# Patient Record
Sex: Female | Born: 1985 | Race: Black or African American | Hispanic: No | Marital: Single
Health system: Southern US, Community
[De-identification: ages and names within clinical notes are randomized; demographics above are authoritative.]

---

## 2006-09-13 ENCOUNTER — Emergency Department (HOSPITAL_COMMUNITY): Admission: EM | Admit: 2006-09-13 | Discharge: 2006-09-13 | Payer: Self-pay | Admitting: Emergency Medicine

## 2008-12-14 ENCOUNTER — Emergency Department (HOSPITAL_COMMUNITY): Admission: EM | Admit: 2008-12-14 | Discharge: 2008-12-14 | Payer: Self-pay | Admitting: Emergency Medicine

## 2009-02-01 ENCOUNTER — Ambulatory Visit (HOSPITAL_COMMUNITY): Admission: RE | Admit: 2009-02-01 | Discharge: 2009-02-01 | Payer: Self-pay | Admitting: Family Medicine

## 2009-06-22 ENCOUNTER — Emergency Department (HOSPITAL_COMMUNITY): Admission: EM | Admit: 2009-06-22 | Discharge: 2009-06-22 | Payer: Self-pay | Admitting: Emergency Medicine

## 2009-07-10 ENCOUNTER — Emergency Department (HOSPITAL_COMMUNITY): Admission: EM | Admit: 2009-07-10 | Discharge: 2009-07-10 | Payer: Self-pay | Admitting: Emergency Medicine

## 2009-11-26 ENCOUNTER — Emergency Department (HOSPITAL_COMMUNITY): Admission: EM | Admit: 2009-11-26 | Discharge: 2009-11-27 | Payer: Self-pay | Admitting: Emergency Medicine

## 2010-07-29 ENCOUNTER — Emergency Department (HOSPITAL_COMMUNITY)
Admission: EM | Admit: 2010-07-29 | Discharge: 2010-07-29 | Payer: Self-pay | Source: Home / Self Care | Admitting: Emergency Medicine

## 2010-08-07 ENCOUNTER — Emergency Department (HOSPITAL_COMMUNITY): Admission: EM | Admit: 2010-08-07 | Discharge: 2009-10-23 | Payer: Self-pay | Admitting: Emergency Medicine

## 2010-11-11 LAB — URINALYSIS, ROUTINE W REFLEX MICROSCOPIC
Hgb urine dipstick: NEGATIVE
Ketones, ur: NEGATIVE mg/dL
Protein, ur: NEGATIVE mg/dL
Urobilinogen, UA: 0.2 mg/dL (ref 0.0–1.0)

## 2010-11-11 LAB — POCT I-STAT, CHEM 8
HCT: 42 % (ref 36.0–46.0)
Hemoglobin: 14.3 g/dL (ref 12.0–15.0)
Potassium: 4 mEq/L (ref 3.5–5.1)
Sodium: 144 mEq/L (ref 135–145)

## 2010-11-19 LAB — URINALYSIS, ROUTINE W REFLEX MICROSCOPIC
Bilirubin Urine: NEGATIVE
Hgb urine dipstick: NEGATIVE
Nitrite: NEGATIVE
pH: 6.5 (ref 5.0–8.0)

## 2010-11-19 LAB — WET PREP, GENITAL
Clue Cells Wet Prep HPF POC: NONE SEEN
Trich, Wet Prep: NONE SEEN
Yeast Wet Prep HPF POC: NONE SEEN

## 2010-11-19 LAB — URINE MICROSCOPIC-ADD ON

## 2010-11-19 LAB — GC/CHLAMYDIA PROBE AMP, GENITAL: Chlamydia, DNA Probe: NEGATIVE

## 2010-11-19 LAB — PREGNANCY, URINE: Preg Test, Ur: NEGATIVE

## 2010-11-23 LAB — POCT I-STAT, CHEM 8
BUN: 11 mg/dL (ref 6–23)
Chloride: 103 mEq/L (ref 96–112)
Potassium: 3.4 mEq/L — ABNORMAL LOW (ref 3.5–5.1)
Sodium: 141 mEq/L (ref 135–145)
TCO2: 29 mmol/L (ref 0–100)

## 2010-11-23 LAB — URINALYSIS, ROUTINE W REFLEX MICROSCOPIC
Glucose, UA: NEGATIVE mg/dL
Hgb urine dipstick: NEGATIVE
Specific Gravity, Urine: 1.023 (ref 1.005–1.030)
pH: 8 (ref 5.0–8.0)

## 2010-11-23 LAB — URINE MICROSCOPIC-ADD ON

## 2010-12-03 LAB — URINALYSIS, ROUTINE W REFLEX MICROSCOPIC
Ketones, ur: NEGATIVE mg/dL
Nitrite: NEGATIVE
Specific Gravity, Urine: 1.028 (ref 1.005–1.030)
pH: 6.5 (ref 5.0–8.0)

## 2010-12-03 LAB — URINE MICROSCOPIC-ADD ON

## 2010-12-03 LAB — PREGNANCY, URINE: Preg Test, Ur: NEGATIVE

## 2010-12-04 LAB — CBC
RBC: 4.49 MIL/uL (ref 3.87–5.11)
WBC: 5.9 10*3/uL (ref 4.0–10.5)

## 2010-12-04 LAB — URINALYSIS, ROUTINE W REFLEX MICROSCOPIC
Glucose, UA: NEGATIVE mg/dL
Specific Gravity, Urine: 1.031 — ABNORMAL HIGH (ref 1.005–1.030)
Urobilinogen, UA: 1 mg/dL (ref 0.0–1.0)

## 2010-12-04 LAB — POCT I-STAT, CHEM 8
Chloride: 104 mEq/L (ref 96–112)
Creatinine, Ser: 0.9 mg/dL (ref 0.4–1.2)
Glucose, Bld: 97 mg/dL (ref 70–99)
HCT: 43 % (ref 36.0–46.0)
Potassium: 3.6 mEq/L (ref 3.5–5.1)
Sodium: 143 mEq/L (ref 135–145)

## 2010-12-04 LAB — URINE MICROSCOPIC-ADD ON

## 2010-12-04 LAB — DIFFERENTIAL
Lymphs Abs: 2.6 10*3/uL (ref 0.7–4.0)
Monocytes Relative: 7 % (ref 3–12)
Neutro Abs: 2.8 10*3/uL (ref 1.7–7.7)
Neutrophils Relative %: 47 % (ref 43–77)

## 2012-10-28 IMAGING — CT CT HEAD W/O CM
1 of 2 series · 16 of 30 positions shown, 20 images · non-contrast
Comparison: CT and MRI 11/26/2009

CLINICAL DATA: Seizure.  Past medical history of syncope for
unknown reasons.

CT HEAD WITHOUT CONTRAST
TECHNIQUE: Contiguous axial images were obtained from the base of
the skull through the vertex without contrast.

[Series 3: recon 2: brain · axial · 0.47mm/px · z∈[+145,+272]mm · 16 of 56 slices shown, 20 images]
[im 3/56  brain]
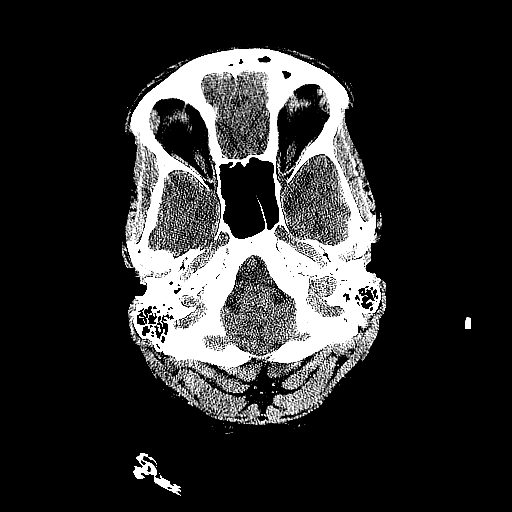
[im 3/56  bone]
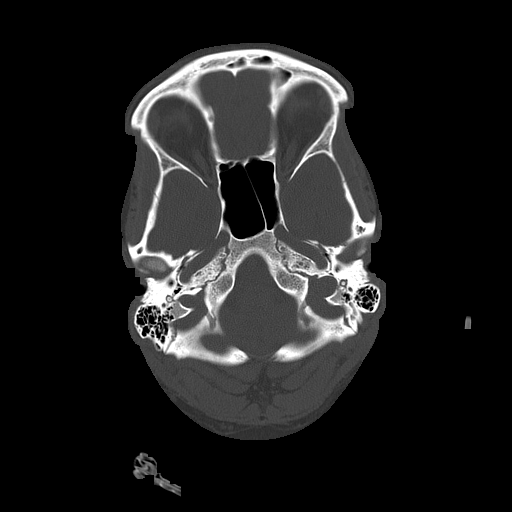
[im 6/56  brain]
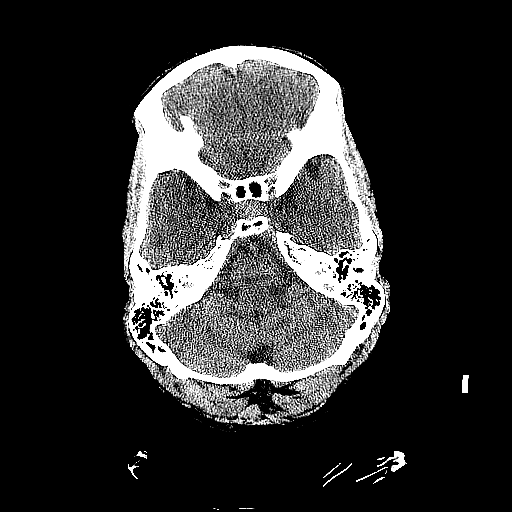
[im 9/56  brain]
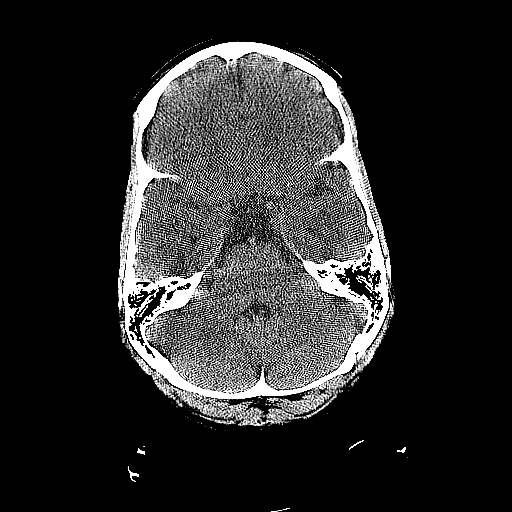
[im 12/56  brain]
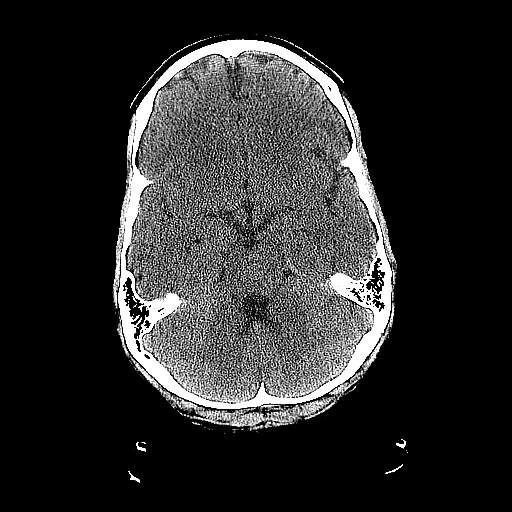
[im 18/56  brain]
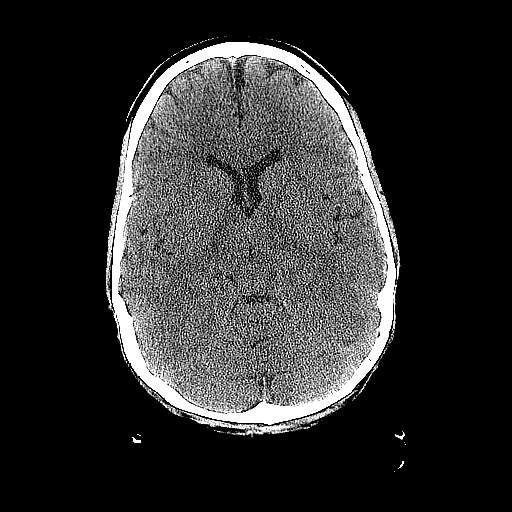
[im 18/56  bone]
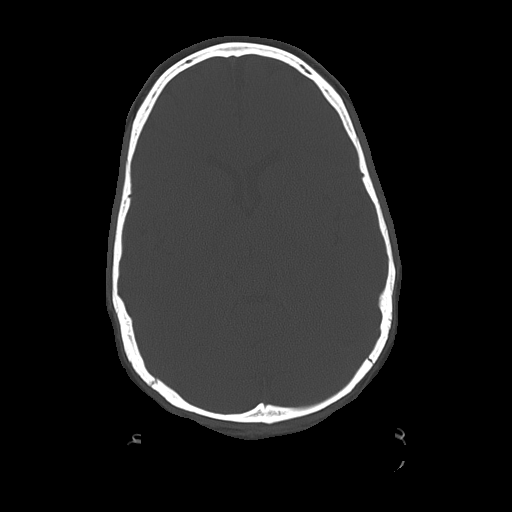
[im 21/56  brain]
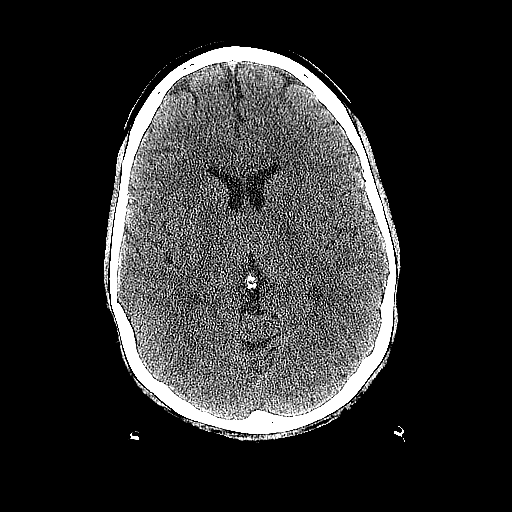
[im 24/56  brain]
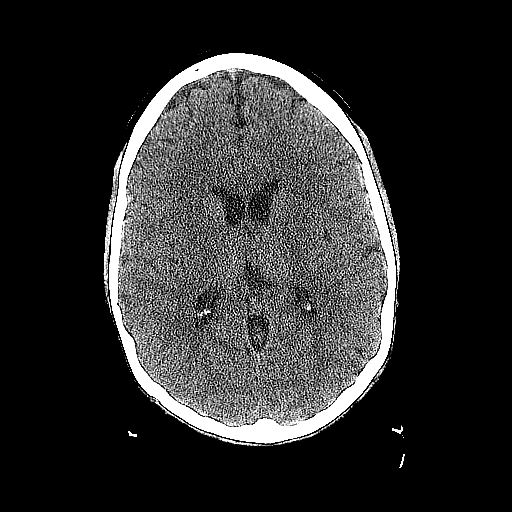
[im 27/56  brain]
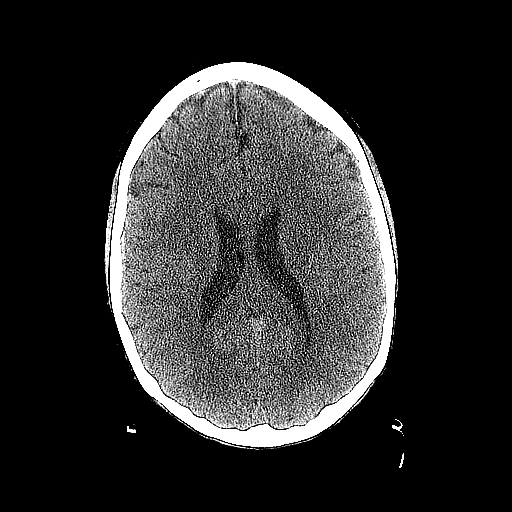
[im 29/56  brain]
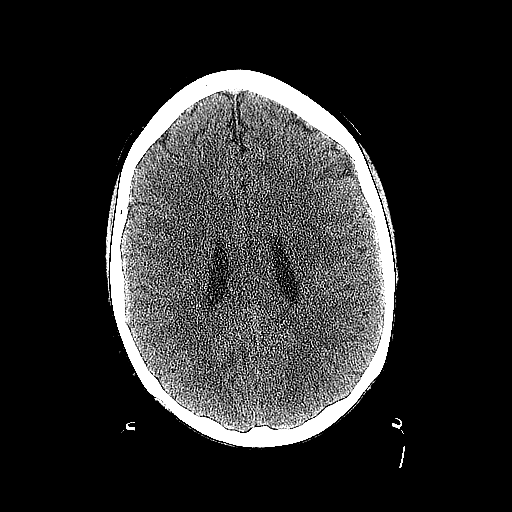
[im 29/56  bone]
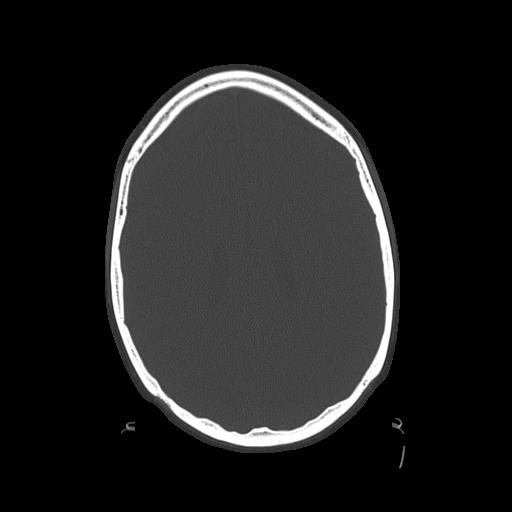
[im 32/56  brain]
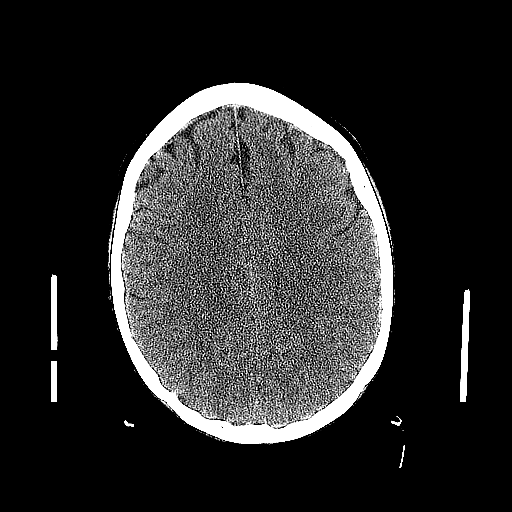
[im 35/56  brain]
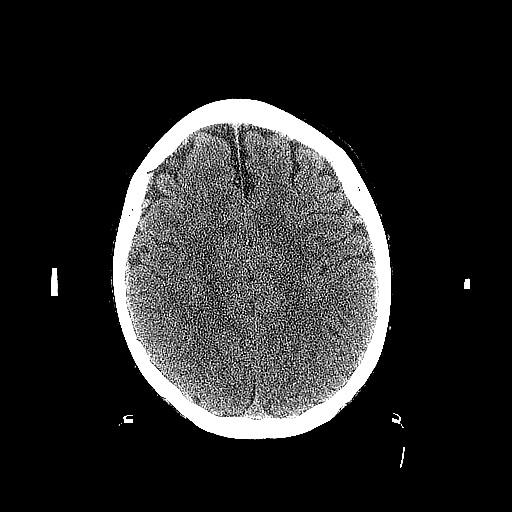
[im 38/56  brain]
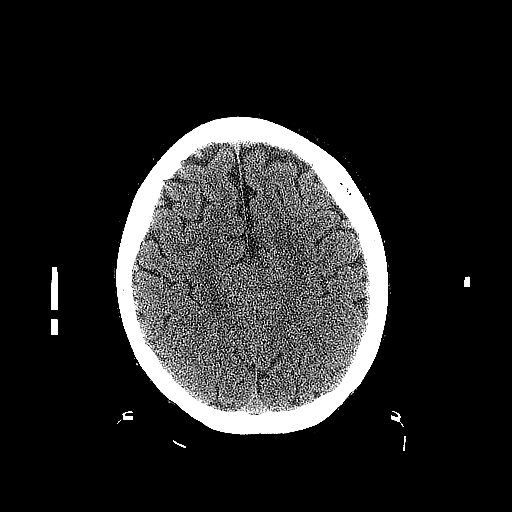
[im 44/56  brain]
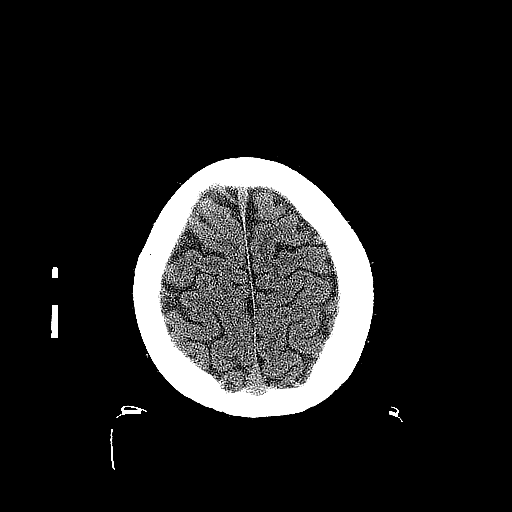
[im 44/56  bone]
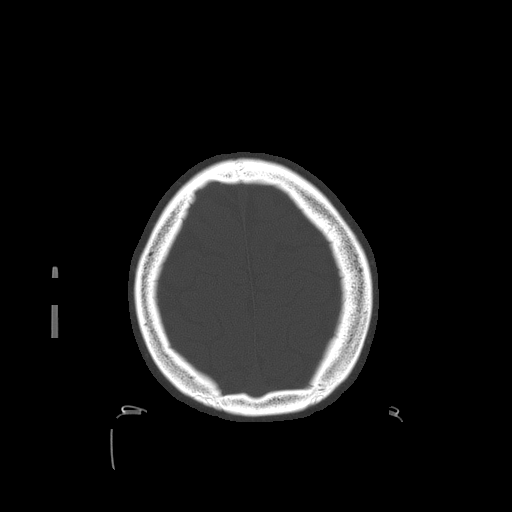
[im 47/56  brain]
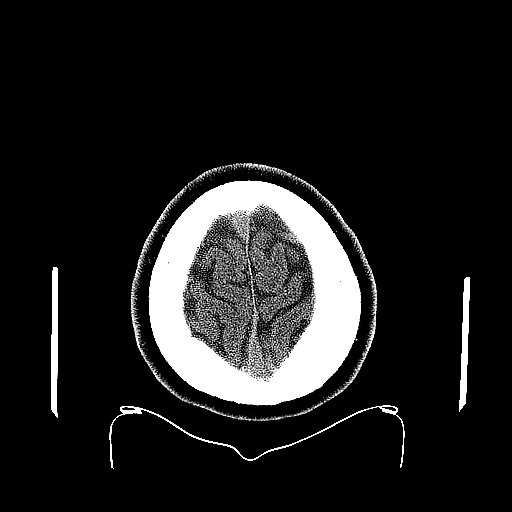
[im 50/56  brain]
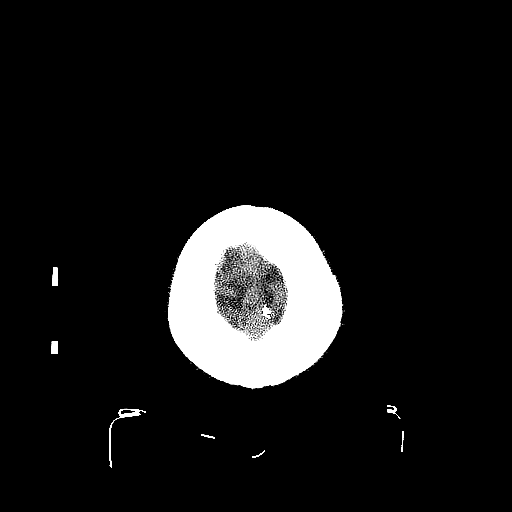
[im 53/56  brain]
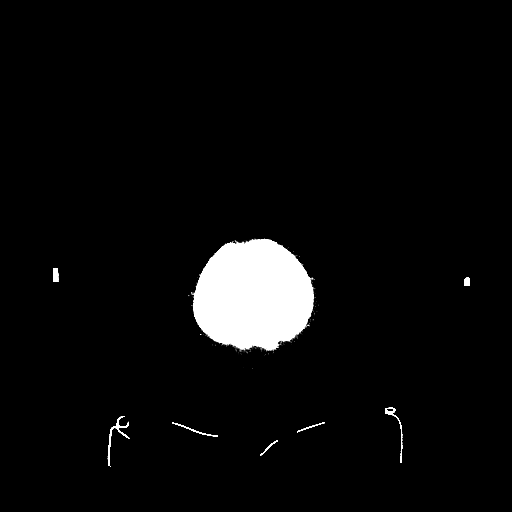

[16 of 30 positions shown; findings below may reference images not displayed]

FINDINGS: There is no intra or extra-axial fluid collection or mass
lesion.  The basilar cisterns and ventricles have a normal
appearance.  There is no CT evidence for acute infarction or
hemorrhage.

Bone windows show no acute findings.
IMPRESSION: Negative exam.

## 2014-10-08 ENCOUNTER — Telehealth (HOSPITAL_COMMUNITY): Payer: Self-pay | Admitting: Vascular Surgery

## 2014-10-10 NOTE — Telephone Encounter (Signed)
Open in error
